# Patient Record
Sex: Female | Born: 1955
Health system: Southern US, Community
[De-identification: ages and names within clinical notes are randomized; demographics above are authoritative.]

## PROBLEM LIST (undated history)

## (undated) DIAGNOSIS — K219 Gastro-esophageal reflux disease without esophagitis: Secondary | ICD-10-CM

## (undated) DIAGNOSIS — C50919 Malignant neoplasm of unspecified site of unspecified female breast: Secondary | ICD-10-CM

## (undated) HISTORY — PX: APPENDECTOMY: SHX54

## (undated) HISTORY — PX: COLONOSCOPY: SHX174

## (undated) HISTORY — PX: CHOLECYSTECTOMY: SHX55

---

## 2000-06-05 ENCOUNTER — Inpatient Hospital Stay (HOSPITAL_COMMUNITY): Admission: EM | Admit: 2000-06-05 | Discharge: 2000-06-09 | Payer: Self-pay | Admitting: *Deleted

## 2002-12-17 ENCOUNTER — Ambulatory Visit (HOSPITAL_COMMUNITY): Admission: RE | Admit: 2002-12-17 | Discharge: 2002-12-17 | Payer: Self-pay

## 2004-04-28 ENCOUNTER — Ambulatory Visit (HOSPITAL_COMMUNITY): Admission: RE | Admit: 2004-04-28 | Discharge: 2004-04-28 | Payer: Self-pay | Admitting: Internal Medicine

## 2005-04-15 ENCOUNTER — Ambulatory Visit: Payer: Self-pay | Admitting: Cardiology

## 2006-10-11 DIAGNOSIS — C50919 Malignant neoplasm of unspecified site of unspecified female breast: Secondary | ICD-10-CM

## 2006-10-11 HISTORY — PX: BREAST LUMPECTOMY: SHX2

## 2006-10-11 HISTORY — PX: REDUCTION MAMMAPLASTY: SUR839

## 2006-10-11 HISTORY — DX: Malignant neoplasm of unspecified site of unspecified female breast: C50.919

## 2007-05-28 ENCOUNTER — Encounter: Admission: RE | Admit: 2007-05-28 | Discharge: 2007-05-28 | Payer: Self-pay | Admitting: General Surgery

## 2008-02-09 ENCOUNTER — Ambulatory Visit: Admission: RE | Admit: 2008-02-09 | Discharge: 2008-05-03 | Payer: Self-pay | Admitting: Radiation Oncology

## 2008-11-12 ENCOUNTER — Ambulatory Visit: Payer: Self-pay | Admitting: Cardiology

## 2009-04-30 ENCOUNTER — Ambulatory Visit: Payer: Self-pay | Admitting: Cardiology

## 2009-05-02 ENCOUNTER — Encounter: Payer: Self-pay | Admitting: Internal Medicine

## 2009-08-28 ENCOUNTER — Ambulatory Visit: Payer: Self-pay | Admitting: Cardiology

## 2010-10-31 ENCOUNTER — Encounter: Payer: Self-pay | Admitting: Internal Medicine

## 2011-02-26 NOTE — Consult Note (Signed)
NAME:  Natasha Andrade, Natasha Andrade                         ACCOUNT NO.:  [id number]   MEDICAL RECORD NO.:  [id number]                  PATIENT TYPE:   LOCATION:                                       FACILITY:   PHYSICIAN:  R. Roetta Sessions, M.D.              DATE OF BIRTH:  Feb 01, 1956   DATE OF CONSULTATION:  12/05/2002  DATE OF DISCHARGE:                                   CONSULTATION   REASON FOR CONSULTATION:  Iron deficiency anemia.   HISTORY OF PRESENT ILLNESS:  The patient is a pleasant 55 year old lady sent  over at the courtesy of Dr. Doreen Beam of internal medicine to further  evaluate anemia.  Apparently, she has a very low ferratin and, as well, a  B12 level producing anemia.  She has not had any melena or rectal bleeding.  No upper GI tract symptoms such as odynophagia, dysphagia, early satiety, or  reflux symptoms (except on occasion), nausea, vomiting.  No change in  weight.  Family history is significant for maternal uncle with colon cancer.   The patient apparently has been found to have a B12 deficiency, has been  supplemented.  Apparently she has attempted to give blood on numerous  occasions and has been turned down because her blood was low.  However,  one month ago she was accepted and did give a unit of blood.  Apparently,  some consideration of doing one of the Schilling tests in the near future.  She has never had her lower GI tract evaluated.  She does not use  nonsteroidal aids.   PAST MEDICAL HISTORY:  Neuropathy and lower extremity muscle wasting; she  has seen a neurologist and a podiatrist.  No specific diagnosis given as of  yet.   PAST SURGERY:  1. Appendectomy.  2. Cholecystectomy.  3. Tubal ligation.  4. Breast implant placement and removal.   MEDICATIONS:  1. She just finished a Z pack for URI.  2. Ambien 10 mg one-half tablet at bedtime p.r.n.   ALLERGIES:  No known drug allergies.   FAMILY HISTORY:  Mother succumbed to lung carcinoma.  Her  father died of  kidney failure.  One sister with kidney carcinoma; a kidney was removed six  months ago.  Otherwise, no history of chronic GI or liver disease, except in  one maternal uncle as described above.   SOCIAL HISTORY:  The patient has been married for seven years, two children,  good health.  Works for [employer] in [place].  Does not use tobacco.  Occasionally has an alcoholic beverage.   REVIEW OF SYSTEMS:  No chest pain, no dyspnea, no change in weight.  No  fever or chills.   PHYSICAL EXAMINATION:  GENERAL:  Pleasant 55 year old resting comfortably.  VITAL SIGNS:  Weight 139.5, height 5 feet 4 inches.  Temperature 98.3, blood  pressure 98/60, pulse 64.  SKIN:  Warm and dry.  HEENT:  No scleral icterus.  Conjunctivae are pink.  JVP is not prominent.  CHEST:  Lungs clear to auscultation.  CARDIAC:  Regular rate and rhythm without murmur, gallop, or rub.  ABDOMEN:  Nondistended, positive bowel sounds.  Soft, nontender without  appreciable mass or organomegaly.  EXTREMITIES:  No edema.  RECTAL:  Exam deferred to the time of the colonoscopy.   IMPRESSION:  The patient is a pleasant 55 year old lady, aside from some  chronic constipation and rare reflux symptoms is devoid of any  gastrointestinal tract symptoms.  She has iron deficiency anemia and  apparently has documented B12 deficiency.  She has recently given blood to  the [employer].  Not mentioned above, she does have what she describes as  regular periods.   I agree with Dr. Sherril Croon that she needs to have an EGD and a colonoscopy.  Will  give some consideration to biopsy her small bowel just to screen her for  celiac disease at the time of EGD.  I have discussed the EGD and colonoscopy  with the patient.  Potential risks, benefits, and alternatives have been  reviewed, questions answered, and she is agreeable.  Plan to perform EGD and  colonoscopy in the near future at [facility].  Further  recommendations to  follow.   I would like to Dr. Sherril Croon for allowing me to see this nice lady today.                                               Natasha Andrade, M.D.    RMR/MEDQ  D:  12/05/2002  T:  12/05/2002  Job:  [id number]   cc:   Doreen Beam  [address]  [place]  [place] [address]  Fax: [phone]
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
## 2011-02-26 NOTE — Op Note (Signed)
Natasha Andrade, Natasha Andrade                           ACCOUNT NO.:  [id number]   MEDICAL RECORD NO.:  [id number]                   PATIENT TYPE:  AMB   LOCATION:  DAY                                  FACILITY:  [facility]   PHYSICIAN:  Lionel December, M.D.                 DATE OF BIRTH:  01/27/1956   DATE OF PROCEDURE:  04/28/2004  DATE OF DISCHARGE:                                 OPERATIVE REPORT   PROCEDURE:  Givens small bowel capsule study.   ENDOSCOPIST:  Lionel December, M.D.   INDICATIONS:  [name] is a 55 year old Caucasian female who has iron-  deficiency anemia.  She had EGD and colonoscopy back in March by Dr. Jena Gauss  without significant findings.  She has been intolerant of iron therapy.  She  has not had any upper GI bleed.  She is undergoing a capsule study to  complete her GI evaluation.  The procedure and risks were reviewed with the  patient and informed consent was obtained.   DETAILS:  1. The patient swallowed the capsule without any difficulty.  2. Transit time through esophagus 39 seconds.  3. Pylorus was seen at 1 minute and 57 seconds.  4. Capsule was at ileocecal valve at 1 hour.  5. Transit time to ileocecal valve was 5 hours and 38 minutes.   QUALITY OF STUDY:  Satisfactory.   FINDINGS:  There is a small focal area of erythema in both bulbar duodenum.  There were no ulcers, erosions or angiodysplasia.  The mucosa of the small  bowel also had normal feathery appearance.   FINAL DIAGNOSES:  Normal small bowel capsule study.   RECOMMENDATIONS:  1. You may consider total dose iron infusion, will discuss with Dr. Sherril Croon.  2. I also reviewed results of study with the patient over the phone and     suggested she try a pediatric dose of liquid iron.      ___________________________________________                                            Lionel December, M.D.   NR/MEDQ  D:  04/29/2004  T:  04/30/2004  Job:  [id number]   cc:   Doreen Beam  [address]  [place]  [place] [address]  Fax: [phone]

## 2011-02-26 NOTE — Op Note (Signed)
NAMEJAYLENNE, Natasha Andrade                           ACCOUNT NO.:  [id number]   MEDICAL RECORD NO.:  [id number]                   PATIENT TYPE:  AMB   LOCATION:  DAY                                  FACILITY:  [facility]   PHYSICIAN:  R. Roetta Sessions, M.D.              DATE OF BIRTH:  1956-04-10   DATE OF PROCEDURE:  12/17/2002  DATE OF DISCHARGE:                                 OPERATIVE REPORT   PROCEDURE:  Esophagogastroduodenoscopy with small bowel biopsy followed by  diagnostic colonoscopy.   INDICATIONS FOR PROCEDURE:  The patient is a 55 year old lady with iron  deficiency anemia as well as documented B12 deficiency.  She is referred for  further evaluation from a GI standpoint.  EGD and colonoscopy are now being  done.  The procedure has been discussed with the patient at length.  The  potential risks, benefits, and alternatives have been reviewed and questions  answered.  She is agreeable.  Please see my dictated consultation note of  12/05/2002 for more information.   PROCEDURE:  O2 saturation, blood pressure, pulses, and respirations were  monitored throughout the entire procedure.  Conscious sedation with 5 mg  Versed, 100 mg Demerol in divided doses.  The instrument used was the  Olympus video gastroscope and colonoscope.   FINDINGS:   EGD:  Examination of the tubular esophagus revealed no mucosal  abnormalities.  The EG junction was easily traversed.   Stomach:  The gastric cavity was empty and insufflated well with air.  A  thorough examination of the gastric mucosa, including a retroflexed view of  the proximal stomach and esophagogastric junction demonstrated no  abnormalities.  The pylorus was patent and easily traversed.   Duodenum:  Examination of the bulb and second and third portions appeared  normal.   THERAPEUTIC/DIAGNOSTIC MANEUVERS PERFORMED:  Multiple biopsies of the D2 and  D3 were taken to screen for celiac disease.  The patient tolerated the  procedure  well and was prepared for colonoscopy.   COLONOSCOPY:  Digital rectal exam revealed no abnormalities.  The prep was  good.   Rectum:  Examination of the rectal mucosa, including retroflex view of the  anal verge revealed internal hemorrhoids.   Colon:  The colonic mucosa was surveyed from the rectosigmoid junction  through the left transverse and right colon to the area of the appendiceal  orifice, ileocecal valve, and cecum.  These structures were well-seen and  photographed for the record.  The patient had diffuse scattered narrow mouth  left-sided diverticula.  The remainder of the colonic mucosa to the cecum  appeared normal.  The terminal ileum was intubated to 10 cm.  This segment  of the GI tract also appeared normal.  From this level, the scope was slowly  withdrawn.  All previously mentioned mucosal surfaces were again seen, and  again, no other abnormalities were observed.  The patient tolerated both  procedures well and was reactive in endoscopy.   IMPRESSION:  1. Esophagogastroduodenoscopy.  Normal upper gastrointestinal tract, status     post biopsy of duodenum 2 and duodenum 3.  2. Colonoscopy.  Internal hemorrhoids.  Otherwise normal rectum.  Few     scattered left-sided diverticula.  Remainder of colonic mucosa and     terminal ileum appeared normal.   RECOMMENDATIONS:  1. Will follow up on small bowel biopsies.  2. Diverticulosis literature provided to the patient.  3. Further recommendations to follow.                                               Jonathon Bellows, M.D.    RMR/MEDQ  D:  12/17/2002  T:  12/17/2002  Job:  [id number]   cc:   Doreen Beam  [address]  [place]  [place] [address]  Fax: [phone]
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
## 2012-03-21 ENCOUNTER — Encounter: Payer: Self-pay | Admitting: Internal Medicine

## 2012-03-22 ENCOUNTER — Encounter: Payer: Medicare Other | Admitting: Internal Medicine

## 2012-09-21 ENCOUNTER — Encounter: Payer: Medicare Other | Admitting: Internal Medicine

## 2012-10-09 DIAGNOSIS — C50919 Malignant neoplasm of unspecified site of unspecified female breast: Secondary | ICD-10-CM

## 2013-04-11 ENCOUNTER — Encounter: Payer: Medicare Other | Admitting: Internal Medicine

## 2013-04-11 DIAGNOSIS — Z09 Encounter for follow-up examination after completed treatment for conditions other than malignant neoplasm: Secondary | ICD-10-CM

## 2013-04-11 DIAGNOSIS — G589 Mononeuropathy, unspecified: Secondary | ICD-10-CM

## 2013-04-11 DIAGNOSIS — C50919 Malignant neoplasm of unspecified site of unspecified female breast: Secondary | ICD-10-CM

## 2016-01-30 DIAGNOSIS — H2513 Age-related nuclear cataract, bilateral: Secondary | ICD-10-CM | POA: Diagnosis not present

## 2016-01-30 DIAGNOSIS — H40033 Anatomical narrow angle, bilateral: Secondary | ICD-10-CM | POA: Diagnosis not present

## 2016-02-06 DIAGNOSIS — G44219 Episodic tension-type headache, not intractable: Secondary | ICD-10-CM | POA: Diagnosis not present

## 2016-02-20 DIAGNOSIS — N390 Urinary tract infection, site not specified: Secondary | ICD-10-CM | POA: Diagnosis not present

## 2016-02-20 DIAGNOSIS — Z789 Other specified health status: Secondary | ICD-10-CM | POA: Diagnosis not present

## 2016-02-20 DIAGNOSIS — Z299 Encounter for prophylactic measures, unspecified: Secondary | ICD-10-CM | POA: Diagnosis not present

## 2016-02-20 DIAGNOSIS — R3911 Hesitancy of micturition: Secondary | ICD-10-CM | POA: Diagnosis not present

## 2016-03-16 DIAGNOSIS — Z79899 Other long term (current) drug therapy: Secondary | ICD-10-CM | POA: Diagnosis not present

## 2016-03-16 DIAGNOSIS — Z1211 Encounter for screening for malignant neoplasm of colon: Secondary | ICD-10-CM | POA: Diagnosis not present

## 2016-03-16 DIAGNOSIS — W57XXXA Bitten or stung by nonvenomous insect and other nonvenomous arthropods, initial encounter: Secondary | ICD-10-CM | POA: Diagnosis not present

## 2016-03-16 DIAGNOSIS — Z0000 Encounter for general adult medical examination without abnormal findings: Secondary | ICD-10-CM | POA: Diagnosis not present

## 2016-03-16 DIAGNOSIS — Z7189 Other specified counseling: Secondary | ICD-10-CM | POA: Diagnosis not present

## 2016-03-16 DIAGNOSIS — Z299 Encounter for prophylactic measures, unspecified: Secondary | ICD-10-CM | POA: Diagnosis not present

## 2016-03-16 DIAGNOSIS — Z6826 Body mass index (BMI) 26.0-26.9, adult: Secondary | ICD-10-CM | POA: Diagnosis not present

## 2016-03-16 DIAGNOSIS — E539 Vitamin B deficiency, unspecified: Secondary | ICD-10-CM | POA: Diagnosis not present

## 2016-03-16 DIAGNOSIS — R5383 Other fatigue: Secondary | ICD-10-CM | POA: Diagnosis not present

## 2016-03-16 DIAGNOSIS — Z1389 Encounter for screening for other disorder: Secondary | ICD-10-CM | POA: Diagnosis not present

## 2016-04-22 DIAGNOSIS — Z1231 Encounter for screening mammogram for malignant neoplasm of breast: Secondary | ICD-10-CM | POA: Diagnosis not present

## 2016-05-20 DIAGNOSIS — Z205 Contact with and (suspected) exposure to viral hepatitis: Secondary | ICD-10-CM | POA: Diagnosis not present

## 2016-05-20 DIAGNOSIS — B029 Zoster without complications: Secondary | ICD-10-CM | POA: Diagnosis not present

## 2016-05-20 DIAGNOSIS — C50919 Malignant neoplasm of unspecified site of unspecified female breast: Secondary | ICD-10-CM | POA: Diagnosis not present

## 2016-05-20 DIAGNOSIS — M25511 Pain in right shoulder: Secondary | ICD-10-CM | POA: Diagnosis not present

## 2016-11-01 DIAGNOSIS — Z0001 Encounter for general adult medical examination with abnormal findings: Secondary | ICD-10-CM | POA: Diagnosis not present

## 2016-11-15 DIAGNOSIS — R69 Illness, unspecified: Secondary | ICD-10-CM | POA: Diagnosis not present

## 2016-12-10 DIAGNOSIS — Z0001 Encounter for general adult medical examination with abnormal findings: Secondary | ICD-10-CM | POA: Diagnosis not present

## 2016-12-13 DIAGNOSIS — R69 Illness, unspecified: Secondary | ICD-10-CM | POA: Diagnosis not present

## 2017-01-27 DIAGNOSIS — D485 Neoplasm of uncertain behavior of skin: Secondary | ICD-10-CM | POA: Diagnosis not present

## 2017-01-27 DIAGNOSIS — C44319 Basal cell carcinoma of skin of other parts of face: Secondary | ICD-10-CM | POA: Diagnosis not present

## 2017-01-27 DIAGNOSIS — L821 Other seborrheic keratosis: Secondary | ICD-10-CM | POA: Diagnosis not present

## 2017-02-10 DIAGNOSIS — C44311 Basal cell carcinoma of skin of nose: Secondary | ICD-10-CM | POA: Diagnosis not present

## 2017-02-22 DIAGNOSIS — R69 Illness, unspecified: Secondary | ICD-10-CM | POA: Diagnosis not present

## 2017-03-14 DIAGNOSIS — Z6823 Body mass index (BMI) 23.0-23.9, adult: Secondary | ICD-10-CM | POA: Diagnosis not present

## 2017-03-14 DIAGNOSIS — M76891 Other specified enthesopathies of right lower limb, excluding foot: Secondary | ICD-10-CM | POA: Diagnosis not present

## 2017-03-14 DIAGNOSIS — Z1329 Encounter for screening for other suspected endocrine disorder: Secondary | ICD-10-CM | POA: Diagnosis not present

## 2017-03-14 DIAGNOSIS — Z79899 Other long term (current) drug therapy: Secondary | ICD-10-CM | POA: Diagnosis not present

## 2017-03-14 DIAGNOSIS — Z13220 Encounter for screening for lipoid disorders: Secondary | ICD-10-CM | POA: Diagnosis not present

## 2017-03-14 DIAGNOSIS — R69 Illness, unspecified: Secondary | ICD-10-CM | POA: Diagnosis not present

## 2017-05-23 ENCOUNTER — Other Ambulatory Visit (HOSPITAL_COMMUNITY): Payer: Self-pay | Admitting: Internal Medicine

## 2017-05-23 DIAGNOSIS — Z1231 Encounter for screening mammogram for malignant neoplasm of breast: Secondary | ICD-10-CM

## 2017-05-30 ENCOUNTER — Ambulatory Visit (HOSPITAL_COMMUNITY)
Admission: RE | Admit: 2017-05-30 | Discharge: 2017-05-30 | Disposition: A | Discharge status: Home / Self Care | Payer: Medicare HMO | Source: Ambulatory Visit | Attending: Internal Medicine | Admitting: Internal Medicine

## 2017-05-30 ENCOUNTER — Encounter (HOSPITAL_COMMUNITY): Payer: Self-pay | Admitting: Radiology

## 2017-05-30 DIAGNOSIS — Z1231 Encounter for screening mammogram for malignant neoplasm of breast: Secondary | ICD-10-CM

## 2017-05-30 HISTORY — DX: Malignant neoplasm of unspecified site of unspecified female breast: C50.919

## 2017-06-14 DIAGNOSIS — R5383 Other fatigue: Secondary | ICD-10-CM | POA: Diagnosis not present

## 2017-06-14 DIAGNOSIS — R69 Illness, unspecified: Secondary | ICD-10-CM | POA: Diagnosis not present

## 2017-06-14 DIAGNOSIS — Z6822 Body mass index (BMI) 22.0-22.9, adult: Secondary | ICD-10-CM | POA: Diagnosis not present

## 2017-06-14 DIAGNOSIS — D518 Other vitamin B12 deficiency anemias: Secondary | ICD-10-CM | POA: Diagnosis not present

## 2017-06-14 DIAGNOSIS — R5381 Other malaise: Secondary | ICD-10-CM | POA: Diagnosis not present

## 2017-06-16 DIAGNOSIS — R69 Illness, unspecified: Secondary | ICD-10-CM | POA: Diagnosis not present

## 2017-08-30 DIAGNOSIS — G5601 Carpal tunnel syndrome, right upper limb: Secondary | ICD-10-CM | POA: Diagnosis not present

## 2017-08-30 DIAGNOSIS — S46011A Strain of muscle(s) and tendon(s) of the rotator cuff of right shoulder, initial encounter: Secondary | ICD-10-CM | POA: Diagnosis not present

## 2017-08-30 DIAGNOSIS — M542 Cervicalgia: Secondary | ICD-10-CM | POA: Diagnosis not present

## 2017-09-05 DIAGNOSIS — R69 Illness, unspecified: Secondary | ICD-10-CM | POA: Diagnosis not present

## 2017-09-06 DIAGNOSIS — R69 Illness, unspecified: Secondary | ICD-10-CM | POA: Diagnosis not present

## 2017-09-12 DIAGNOSIS — J302 Other seasonal allergic rhinitis: Secondary | ICD-10-CM | POA: Diagnosis not present

## 2017-09-12 DIAGNOSIS — R69 Illness, unspecified: Secondary | ICD-10-CM | POA: Diagnosis not present

## 2017-09-12 DIAGNOSIS — M545 Low back pain: Secondary | ICD-10-CM | POA: Diagnosis not present

## 2017-09-12 DIAGNOSIS — Z6823 Body mass index (BMI) 23.0-23.9, adult: Secondary | ICD-10-CM | POA: Diagnosis not present

## 2017-11-26 DIAGNOSIS — R69 Illness, unspecified: Secondary | ICD-10-CM | POA: Diagnosis not present

## 2017-12-05 DIAGNOSIS — R69 Illness, unspecified: Secondary | ICD-10-CM | POA: Diagnosis not present

## 2017-12-15 DIAGNOSIS — R69 Illness, unspecified: Secondary | ICD-10-CM | POA: Diagnosis not present

## 2017-12-15 DIAGNOSIS — Z6823 Body mass index (BMI) 23.0-23.9, adult: Secondary | ICD-10-CM | POA: Diagnosis not present

## 2017-12-15 DIAGNOSIS — Z0000 Encounter for general adult medical examination without abnormal findings: Secondary | ICD-10-CM | POA: Diagnosis not present

## 2017-12-15 DIAGNOSIS — J302 Other seasonal allergic rhinitis: Secondary | ICD-10-CM | POA: Diagnosis not present

## 2017-12-15 DIAGNOSIS — M545 Low back pain: Secondary | ICD-10-CM | POA: Diagnosis not present

## 2018-01-10 DIAGNOSIS — H5200 Hypermetropia, unspecified eye: Secondary | ICD-10-CM | POA: Diagnosis not present

## 2018-01-10 DIAGNOSIS — M7021 Olecranon bursitis, right elbow: Secondary | ICD-10-CM | POA: Diagnosis not present

## 2018-01-10 DIAGNOSIS — Z0100 Encounter for examination of eyes and vision without abnormal findings: Secondary | ICD-10-CM | POA: Diagnosis not present

## 2018-01-10 DIAGNOSIS — M75121 Complete rotator cuff tear or rupture of right shoulder, not specified as traumatic: Secondary | ICD-10-CM | POA: Diagnosis not present

## 2018-04-17 DIAGNOSIS — J302 Other seasonal allergic rhinitis: Secondary | ICD-10-CM | POA: Diagnosis not present

## 2018-04-17 DIAGNOSIS — R69 Illness, unspecified: Secondary | ICD-10-CM | POA: Diagnosis not present

## 2018-04-17 DIAGNOSIS — Z79899 Other long term (current) drug therapy: Secondary | ICD-10-CM | POA: Diagnosis not present

## 2018-04-17 DIAGNOSIS — Z6823 Body mass index (BMI) 23.0-23.9, adult: Secondary | ICD-10-CM | POA: Diagnosis not present

## 2018-04-17 DIAGNOSIS — S43421A Sprain of right rotator cuff capsule, initial encounter: Secondary | ICD-10-CM | POA: Diagnosis not present

## 2018-04-17 DIAGNOSIS — M545 Low back pain: Secondary | ICD-10-CM | POA: Diagnosis not present

## 2018-04-17 DIAGNOSIS — Z0000 Encounter for general adult medical examination without abnormal findings: Secondary | ICD-10-CM | POA: Diagnosis not present

## 2018-05-16 DIAGNOSIS — M25511 Pain in right shoulder: Secondary | ICD-10-CM | POA: Diagnosis not present

## 2018-05-24 DIAGNOSIS — M25511 Pain in right shoulder: Secondary | ICD-10-CM | POA: Diagnosis not present

## 2018-05-31 DIAGNOSIS — M25511 Pain in right shoulder: Secondary | ICD-10-CM | POA: Diagnosis not present

## 2018-06-30 ENCOUNTER — Other Ambulatory Visit (HOSPITAL_COMMUNITY): Payer: Self-pay | Admitting: Internal Medicine

## 2018-06-30 DIAGNOSIS — Z1231 Encounter for screening mammogram for malignant neoplasm of breast: Secondary | ICD-10-CM

## 2018-07-06 ENCOUNTER — Ambulatory Visit (HOSPITAL_COMMUNITY)
Admission: RE | Admit: 2018-07-06 | Discharge: 2018-07-06 | Disposition: A | Discharge status: Home / Self Care | Payer: Medicare HMO | Source: Ambulatory Visit | Attending: Internal Medicine | Admitting: Internal Medicine

## 2018-07-06 ENCOUNTER — Encounter (HOSPITAL_COMMUNITY): Payer: Self-pay

## 2018-07-06 DIAGNOSIS — Z1231 Encounter for screening mammogram for malignant neoplasm of breast: Secondary | ICD-10-CM | POA: Insufficient documentation

## 2018-07-17 DIAGNOSIS — R69 Illness, unspecified: Secondary | ICD-10-CM | POA: Diagnosis not present

## 2018-07-17 DIAGNOSIS — Z6824 Body mass index (BMI) 24.0-24.9, adult: Secondary | ICD-10-CM | POA: Diagnosis not present

## 2018-07-17 DIAGNOSIS — J302 Other seasonal allergic rhinitis: Secondary | ICD-10-CM | POA: Diagnosis not present

## 2018-07-17 DIAGNOSIS — S43421D Sprain of right rotator cuff capsule, subsequent encounter: Secondary | ICD-10-CM | POA: Diagnosis not present

## 2018-08-07 DIAGNOSIS — R69 Illness, unspecified: Secondary | ICD-10-CM | POA: Diagnosis not present

## 2018-10-17 DIAGNOSIS — Z6825 Body mass index (BMI) 25.0-25.9, adult: Secondary | ICD-10-CM | POA: Diagnosis not present

## 2018-10-17 DIAGNOSIS — J329 Chronic sinusitis, unspecified: Secondary | ICD-10-CM | POA: Diagnosis not present

## 2018-12-07 DIAGNOSIS — R69 Illness, unspecified: Secondary | ICD-10-CM | POA: Diagnosis not present

## 2019-02-13 DIAGNOSIS — Z6825 Body mass index (BMI) 25.0-25.9, adult: Secondary | ICD-10-CM | POA: Diagnosis not present

## 2019-02-13 DIAGNOSIS — L309 Dermatitis, unspecified: Secondary | ICD-10-CM | POA: Diagnosis not present

## 2019-05-16 DIAGNOSIS — R69 Illness, unspecified: Secondary | ICD-10-CM | POA: Diagnosis not present

## 2019-05-16 DIAGNOSIS — Z6824 Body mass index (BMI) 24.0-24.9, adult: Secondary | ICD-10-CM | POA: Diagnosis not present

## 2019-05-16 DIAGNOSIS — Z0000 Encounter for general adult medical examination without abnormal findings: Secondary | ICD-10-CM | POA: Diagnosis not present

## 2019-05-16 DIAGNOSIS — M545 Low back pain: Secondary | ICD-10-CM | POA: Diagnosis not present

## 2019-05-16 DIAGNOSIS — Z1389 Encounter for screening for other disorder: Secondary | ICD-10-CM | POA: Diagnosis not present

## 2019-06-20 ENCOUNTER — Other Ambulatory Visit (HOSPITAL_COMMUNITY): Payer: Self-pay | Admitting: Internal Medicine

## 2019-06-20 DIAGNOSIS — Z1231 Encounter for screening mammogram for malignant neoplasm of breast: Secondary | ICD-10-CM

## 2019-07-09 ENCOUNTER — Other Ambulatory Visit: Payer: Self-pay

## 2019-07-09 ENCOUNTER — Ambulatory Visit (HOSPITAL_COMMUNITY)
Admission: RE | Admit: 2019-07-09 | Discharge: 2019-07-09 | Disposition: A | Discharge status: Home / Self Care | Payer: Medicare HMO | Source: Ambulatory Visit | Attending: Internal Medicine | Admitting: Internal Medicine

## 2019-07-09 DIAGNOSIS — Z1231 Encounter for screening mammogram for malignant neoplasm of breast: Secondary | ICD-10-CM | POA: Diagnosis not present

## 2019-07-16 DIAGNOSIS — M8588 Other specified disorders of bone density and structure, other site: Secondary | ICD-10-CM | POA: Diagnosis not present

## 2019-07-16 DIAGNOSIS — M810 Age-related osteoporosis without current pathological fracture: Secondary | ICD-10-CM | POA: Diagnosis not present

## 2019-07-27 ENCOUNTER — Other Ambulatory Visit: Payer: Self-pay

## 2019-07-27 DIAGNOSIS — Z20828 Contact with and (suspected) exposure to other viral communicable diseases: Secondary | ICD-10-CM | POA: Diagnosis not present

## 2019-07-27 DIAGNOSIS — Z20822 Contact with and (suspected) exposure to covid-19: Secondary | ICD-10-CM

## 2019-07-28 LAB — NOVEL CORONAVIRUS, NAA: SARS-CoV-2, NAA: NOT DETECTED

## 2019-07-31 ENCOUNTER — Telehealth: Payer: Self-pay | Admitting: General Practice

## 2019-07-31 NOTE — Telephone Encounter (Signed)
Gave patient negative covid test results Patient understood 

## 2019-08-02 DIAGNOSIS — D225 Melanocytic nevi of trunk: Secondary | ICD-10-CM | POA: Diagnosis not present

## 2019-08-02 DIAGNOSIS — C44311 Basal cell carcinoma of skin of nose: Secondary | ICD-10-CM | POA: Diagnosis not present

## 2019-08-02 DIAGNOSIS — L308 Other specified dermatitis: Secondary | ICD-10-CM | POA: Diagnosis not present

## 2019-08-16 DIAGNOSIS — R69 Illness, unspecified: Secondary | ICD-10-CM | POA: Diagnosis not present

## 2019-08-16 DIAGNOSIS — Z6826 Body mass index (BMI) 26.0-26.9, adult: Secondary | ICD-10-CM | POA: Diagnosis not present

## 2019-08-16 DIAGNOSIS — M7501 Adhesive capsulitis of right shoulder: Secondary | ICD-10-CM | POA: Diagnosis not present

## 2019-08-16 DIAGNOSIS — M545 Low back pain: Secondary | ICD-10-CM | POA: Diagnosis not present

## 2019-09-08 DIAGNOSIS — R69 Illness, unspecified: Secondary | ICD-10-CM | POA: Diagnosis not present

## 2019-09-13 DIAGNOSIS — Z85828 Personal history of other malignant neoplasm of skin: Secondary | ICD-10-CM | POA: Diagnosis not present

## 2019-09-13 DIAGNOSIS — Z08 Encounter for follow-up examination after completed treatment for malignant neoplasm: Secondary | ICD-10-CM | POA: Diagnosis not present

## 2019-09-13 DIAGNOSIS — C44311 Basal cell carcinoma of skin of nose: Secondary | ICD-10-CM | POA: Diagnosis not present

## 2019-10-22 ENCOUNTER — Other Ambulatory Visit: Payer: Self-pay

## 2019-10-22 ENCOUNTER — Emergency Department (HOSPITAL_COMMUNITY)
Admission: EM | Admit: 2019-10-22 | Discharge: 2019-10-22 | Disposition: A | Discharge status: Home / Self Care | Payer: Medicare HMO | Attending: Emergency Medicine | Admitting: Emergency Medicine

## 2019-10-22 ENCOUNTER — Encounter (HOSPITAL_COMMUNITY): Payer: Self-pay | Admitting: *Deleted

## 2019-10-22 DIAGNOSIS — U071 COVID-19: Secondary | ICD-10-CM | POA: Diagnosis not present

## 2019-10-22 DIAGNOSIS — B349 Viral infection, unspecified: Secondary | ICD-10-CM | POA: Diagnosis not present

## 2019-10-22 DIAGNOSIS — R05 Cough: Secondary | ICD-10-CM | POA: Diagnosis present

## 2019-10-22 NOTE — Discharge Instructions (Addendum)
Return if any problems.  Your covid test is pending  °

## 2019-10-22 NOTE — ED Provider Notes (Signed)
[facility] Provider Note   CSN: [id number] Arrival date & time: 10/22/19  1357     History No chief complaint on file.   Natasha Andrade is a 64 y.o. female.  The history is provided by the patient. No language interpreter was used.  Cough Cough characteristics:  Non-productive Sputum characteristics:  Nondescript Severity:  Moderate Onset quality:  Gradual Timing:  Constant Progression:  Worsening Chronicity:  New Relieved by:  Nothing Worsened by:  Nothing Ineffective treatments:  None tried Associated symptoms: sinus congestion      Pt complains of nasal congestion and sinus congestion   Past Medical History:  Diagnosis Date  . Breast cancer [facility]) 2008   right    There are no problems to display for this patient.   Past Surgical History:  Procedure Laterality Date  . APPENDECTOMY    . BREAST LUMPECTOMY Right 2008  . CHOLECYSTECTOMY    . REDUCTION MAMMAPLASTY Bilateral 2008     OB History   No obstetric history on file.     No family history on file.  Social History   Tobacco Use  . Smoking status: Never Smoker  . Smokeless tobacco: Never Used  Substance Use Topics  . Alcohol use: Yes    Comment: occasionally   . Drug use: Never    Home Medications Prior to Admission medications   Not on File    Allergies    Patient has no known allergies.  Review of Systems   Review of Systems  Respiratory: Positive for cough.   All other systems reviewed and are negative.   Physical Exam Updated Vital Signs BP 122/89   Pulse 88   Temp 98.4 F (36.9 C) (Oral)   Resp 17   Ht 5\' 4"  (1.626 m)   Wt 72.6 kg   SpO2 100%   BMI 27.46 kg/m   Physical Exam Vitals and nursing note reviewed.  Constitutional:      Appearance: She is well-developed.  HENT:     Head: Normocephalic.     Right Ear: Tympanic membrane normal.     Left Ear: Tympanic membrane normal.     Nose: Nose normal.  Cardiovascular:     Rate and Rhythm:  Normal rate.  Pulmonary:     Effort: Pulmonary effort is normal.  Abdominal:     General: There is no distension.  Musculoskeletal:        General: Normal range of motion.     Cervical back: Normal range of motion.  Skin:    General: Skin is warm.  Neurological:     Mental Status: She is alert and oriented to person, place, and time.  Psychiatric:        Mood and Affect: Mood normal.     ED Results / Procedures / Treatments   Labs (all labs ordered are listed, but only abnormal results are displayed) Labs Reviewed  SARS CORONAVIRUS 2 (TAT 6-24 HRS)    EKG None  Radiology No results found.  Procedures Procedures (including critical care time)  Medications Ordered in ED Medications - No data to display  ED Course  I have reviewed the triage vital signs and the nursing notes.  Pertinent labs & imaging results that were available during my care of the patient were reviewed by me and considered in my medical decision making (see chart for details).    MDM Rules/Calculators/A&P  MDM  (Pt is here with husband who is covid positive.  I suspect she has covid.)  Covid ordered.  Pt advised to quarantine Final Clinical Impression(s) / ED Diagnoses Final diagnoses:  Viral illness    Rx / DC Orders ED Discharge Orders    None    An After Visit Summary was printed and given to the patient.   Sidney Ace 10/22/19 Asencion Gowda, MD 10/22/19 2251

## 2019-10-22 NOTE — ED Triage Notes (Signed)
Pt c/o loss of sense of smell, nasal congestion, low grade fever, fatigue x 2 weeks. Pt took antibiotics about 2 weeks ago for possible sinus infection. Denies known Covid exposure.

## 2019-10-23 LAB — SARS CORONAVIRUS 2 (TAT 6-24 HRS): SARS Coronavirus 2: POSITIVE — AB

## 2019-10-25 ENCOUNTER — Telehealth (HOSPITAL_COMMUNITY): Payer: Self-pay

## 2019-11-19 DIAGNOSIS — R69 Illness, unspecified: Secondary | ICD-10-CM | POA: Diagnosis not present

## 2019-11-19 DIAGNOSIS — M545 Low back pain: Secondary | ICD-10-CM | POA: Diagnosis not present

## 2019-11-19 DIAGNOSIS — Z6827 Body mass index (BMI) 27.0-27.9, adult: Secondary | ICD-10-CM | POA: Diagnosis not present

## 2019-11-19 DIAGNOSIS — J328 Other chronic sinusitis: Secondary | ICD-10-CM | POA: Diagnosis not present

## 2020-01-10 DIAGNOSIS — M2550 Pain in unspecified joint: Secondary | ICD-10-CM | POA: Diagnosis not present

## 2020-01-10 DIAGNOSIS — M79641 Pain in right hand: Secondary | ICD-10-CM | POA: Diagnosis not present

## 2020-01-10 DIAGNOSIS — M545 Low back pain: Secondary | ICD-10-CM | POA: Diagnosis not present

## 2020-01-10 DIAGNOSIS — M19041 Primary osteoarthritis, right hand: Secondary | ICD-10-CM | POA: Diagnosis not present

## 2020-02-18 DIAGNOSIS — M545 Low back pain: Secondary | ICD-10-CM | POA: Diagnosis not present

## 2020-02-18 DIAGNOSIS — R69 Illness, unspecified: Secondary | ICD-10-CM | POA: Diagnosis not present

## 2020-02-18 DIAGNOSIS — Z6827 Body mass index (BMI) 27.0-27.9, adult: Secondary | ICD-10-CM | POA: Diagnosis not present

## 2020-03-27 DIAGNOSIS — H5213 Myopia, bilateral: Secondary | ICD-10-CM | POA: Diagnosis not present

## 2020-05-26 DIAGNOSIS — R69 Illness, unspecified: Secondary | ICD-10-CM | POA: Diagnosis not present

## 2020-05-26 DIAGNOSIS — Z6827 Body mass index (BMI) 27.0-27.9, adult: Secondary | ICD-10-CM | POA: Diagnosis not present

## 2020-05-26 DIAGNOSIS — M545 Low back pain: Secondary | ICD-10-CM | POA: Diagnosis not present

## 2020-06-23 ENCOUNTER — Other Ambulatory Visit (HOSPITAL_COMMUNITY): Payer: Self-pay | Admitting: Internal Medicine

## 2020-06-23 DIAGNOSIS — Z1231 Encounter for screening mammogram for malignant neoplasm of breast: Secondary | ICD-10-CM

## 2020-07-08 ENCOUNTER — Encounter (INDEPENDENT_AMBULATORY_CARE_PROVIDER_SITE_OTHER): Payer: Self-pay | Admitting: Otolaryngology

## 2020-07-08 ENCOUNTER — Other Ambulatory Visit: Payer: Self-pay

## 2020-07-08 ENCOUNTER — Ambulatory Visit (INDEPENDENT_AMBULATORY_CARE_PROVIDER_SITE_OTHER): Payer: Medicare HMO | Admitting: Otolaryngology

## 2020-07-08 VITALS — Temp 97.9°F

## 2020-07-08 DIAGNOSIS — K219 Gastro-esophageal reflux disease without esophagitis: Secondary | ICD-10-CM

## 2020-07-08 DIAGNOSIS — H903 Sensorineural hearing loss, bilateral: Secondary | ICD-10-CM

## 2020-07-08 DIAGNOSIS — J310 Chronic rhinitis: Secondary | ICD-10-CM

## 2020-07-08 NOTE — Progress Notes (Signed)
HPI: Natasha Andrade is a 64 y.o. female who presents for evaluation of decreased hearing where she has difficulty understanding people.  She also complains of chronic postnasal drainage and always trying to clear her throat.  She is unable to blow anything out of her nose.  She has previously been treated with a Z-Pak. She does have history of bad reflux disease and takes pantoprazole 20 mg in the morning.  She has also tried Advil cold and sinus.  She also takes Zyrtec 10 mg. Denies any yellow-green discharge from her nose.  Past Medical History:  Diagnosis Date  . Breast cancer [facility]) 2008   right   Past Surgical History:  Procedure Laterality Date  . APPENDECTOMY    . BREAST LUMPECTOMY Right 2008  . CHOLECYSTECTOMY    . REDUCTION MAMMAPLASTY Bilateral 2008   Social History   Socioeconomic History  . Marital status: Married    Spouse name: Not on file  . Number of children: Not on file  . Years of education: Not on file  . Highest education level: Not on file  Occupational History  . Not on file  Tobacco Use  . Smoking status: Never Smoker  . Smokeless tobacco: Never Used  Substance and Sexual Activity  . Alcohol use: Yes    Comment: occasionally   . Drug use: Never  . Sexual activity: Not on file  Other Topics Concern  . Not on file  Social History Narrative  . Not on file   Social Determinants of Health   Financial Resource Strain:   . Difficulty of Paying Living Expenses: Not on file  Food Insecurity:   . Worried About Charity fundraiser in the Last Year: Not on file  . Ran Out of Food in the Last Year: Not on file  Transportation Needs:   . Lack of Transportation (Medical): Not on file  . Lack of Transportation (Non-Medical): Not on file  Physical Activity:   . Days of Exercise per Week: Not on file  . Minutes of Exercise per Session: Not on file  Stress:   . Feeling of Stress : Not on file  Social Connections:   . Frequency of Communication with Friends  and Family: Not on file  . Frequency of Social Gatherings with Friends and Family: Not on file  . Attends Religious Services: Not on file  . Active Member of Clubs or Organizations: Not on file  . Attends Archivist Meetings: Not on file  . Marital Status: Not on file   No family history on file. No Known Allergies Prior to Admission medications   Not on File     Positive ROS: Otherwise negative  All other systems have been reviewed and were otherwise negative with the exception of those mentioned in the HPI and as above.  Physical Exam: Constitutional: Alert, well-appearing, no acute distress Ears: External ears without lesions or tenderness. Ear canals are clear bilaterally with intact, clear TMs bilaterally. Nasal: External nose without lesions. Septum midline.  Both middle meatus regions are clear..  Nasal passages are clear bilaterally with minimal clear mucus discharge within the nasal cavity. Oral: Lips and gums without lesions. Tongue and palate mucosa without lesions. Posterior oropharynx clear.  Indirect laryngoscopy revealed a clear hypopharynx and larynx.  Moderate mucosal arytenoid edema but no lesions noted. Neck: No palpable adenopathy or masses Respiratory: Breathing comfortably  Skin: No facial/neck lesions or rash noted.  Audiogram demonstrated normal hearing in both ears up to  2000 frequency with dropping hearing in the upper frequencies at 3000 frequency and above down to 40-50 DB.  She had type A tympanograms bilaterally.  Procedures  Assessment: Reviewed with her that she has high-frequency SNHL in both ears and the only treatment option for this would be hearing aids. History of GERD. Mild rhinitis.  Plan: Concerning her complaints of thick mucus and postnasal drainage recommended use of Nasacort 2 sprays each nostril at night as well as saline irrigation and possible use of Mucinex.  Also suggested taking omeprazole before dinner instead of first  thing in the morning as this will provide better coverage of laryngeal pharyngeal reflux which occurs more commonly at night when she is lying flat. Concerning her hearing problems the only option she would have would be hearing aids and discussed this with her.  Radene Journey, MD

## 2020-07-10 ENCOUNTER — Ambulatory Visit (HOSPITAL_COMMUNITY)
Admission: RE | Admit: 2020-07-10 | Discharge: 2020-07-10 | Disposition: A | Discharge status: Home / Self Care | Payer: Medicare HMO | Source: Ambulatory Visit | Attending: Internal Medicine | Admitting: Internal Medicine

## 2020-07-10 ENCOUNTER — Other Ambulatory Visit: Payer: Self-pay

## 2020-07-10 ENCOUNTER — Ambulatory Visit (HOSPITAL_COMMUNITY): Payer: Medicare HMO

## 2020-07-10 DIAGNOSIS — Z1231 Encounter for screening mammogram for malignant neoplasm of breast: Secondary | ICD-10-CM | POA: Insufficient documentation

## 2020-07-17 DIAGNOSIS — R69 Illness, unspecified: Secondary | ICD-10-CM | POA: Diagnosis not present

## 2020-07-21 ENCOUNTER — Encounter (INDEPENDENT_AMBULATORY_CARE_PROVIDER_SITE_OTHER): Payer: Self-pay

## 2020-08-27 DIAGNOSIS — Z1331 Encounter for screening for depression: Secondary | ICD-10-CM | POA: Diagnosis not present

## 2020-08-27 DIAGNOSIS — Z6828 Body mass index (BMI) 28.0-28.9, adult: Secondary | ICD-10-CM | POA: Diagnosis not present

## 2020-08-27 DIAGNOSIS — M545 Low back pain, unspecified: Secondary | ICD-10-CM | POA: Diagnosis not present

## 2020-08-27 DIAGNOSIS — F330 Major depressive disorder, recurrent, mild: Secondary | ICD-10-CM | POA: Diagnosis not present

## 2020-08-27 DIAGNOSIS — Z0000 Encounter for general adult medical examination without abnormal findings: Secondary | ICD-10-CM | POA: Diagnosis not present

## 2020-08-27 DIAGNOSIS — Z131 Encounter for screening for diabetes mellitus: Secondary | ICD-10-CM | POA: Diagnosis not present

## 2020-08-27 DIAGNOSIS — R69 Illness, unspecified: Secondary | ICD-10-CM | POA: Diagnosis not present

## 2020-08-28 DIAGNOSIS — R69 Illness, unspecified: Secondary | ICD-10-CM | POA: Diagnosis not present

## 2021-04-20 DIAGNOSIS — C44311 Basal cell carcinoma of skin of nose: Secondary | ICD-10-CM | POA: Diagnosis not present

## 2021-04-27 DIAGNOSIS — D518 Other vitamin B12 deficiency anemias: Secondary | ICD-10-CM | POA: Diagnosis not present

## 2021-04-27 DIAGNOSIS — Z79899 Other long term (current) drug therapy: Secondary | ICD-10-CM | POA: Diagnosis not present

## 2021-04-27 DIAGNOSIS — M545 Low back pain, unspecified: Secondary | ICD-10-CM | POA: Diagnosis not present

## 2021-04-27 DIAGNOSIS — K219 Gastro-esophageal reflux disease without esophagitis: Secondary | ICD-10-CM | POA: Diagnosis not present

## 2021-04-27 DIAGNOSIS — R5383 Other fatigue: Secondary | ICD-10-CM | POA: Diagnosis not present

## 2021-04-27 DIAGNOSIS — Z0000 Encounter for general adult medical examination without abnormal findings: Secondary | ICD-10-CM | POA: Diagnosis not present

## 2021-04-30 ENCOUNTER — Encounter (INDEPENDENT_AMBULATORY_CARE_PROVIDER_SITE_OTHER): Payer: Self-pay | Admitting: *Deleted

## 2021-05-25 DIAGNOSIS — C44311 Basal cell carcinoma of skin of nose: Secondary | ICD-10-CM | POA: Diagnosis not present

## 2021-07-03 ENCOUNTER — Other Ambulatory Visit: Payer: Self-pay | Admitting: Internal Medicine

## 2021-07-14 ENCOUNTER — Other Ambulatory Visit (INDEPENDENT_AMBULATORY_CARE_PROVIDER_SITE_OTHER): Payer: Self-pay

## 2021-07-14 ENCOUNTER — Telehealth (INDEPENDENT_AMBULATORY_CARE_PROVIDER_SITE_OTHER): Payer: Self-pay

## 2021-07-14 ENCOUNTER — Encounter (INDEPENDENT_AMBULATORY_CARE_PROVIDER_SITE_OTHER): Payer: Self-pay

## 2021-07-14 DIAGNOSIS — Z1211 Encounter for screening for malignant neoplasm of colon: Secondary | ICD-10-CM

## 2021-07-14 MED ORDER — PEG 3350-KCL-NA BICARB-NACL 420 G PO SOLR: 4000.0000 mL | ORAL | 0 refills | Status: DC

## 2021-07-14 NOTE — Telephone Encounter (Signed)
Natasha Andrade, CMA  

## 2021-07-14 NOTE — Telephone Encounter (Signed)
Prep sent

## 2021-07-14 NOTE — Telephone Encounter (Signed)
Referring MD/PCP: Hasanaj  Procedure: Tcs  Reason/Indication:  Screening  Has patient had this procedure before?  yes  If so, when, by whom and where?2004  Is there a family history of colon cancer?  no  Who?  What age when diagnosed?    Is patient diabetic? If yes, Type 1 or Type 2   no      Does patient have prosthetic heart valve or mechanical valve?  no  Do you have a pacemaker/defibrillator?  no  Has patient ever had endocarditis/atrial fibrillation? no  Does patient use oxygen? no  Has patient had joint replacement within last 12 months?  no  Is patient constipated or do they take laxatives? no  Does patient have a history of alcohol/drug use?  no  Have you had a stroke/heart attack last 6 mths? no  Do you take medicine for weight loss?  no  For female patients,: do you still have your menstrual cycle? no  Is patient on blood thinner such as Coumadin, Plavix and/or Aspirin? no  Medications: cyanocobalamin 1000 mcg 1 tab once a week, omeprazole 20 mg daily, quetiapine 50 mg daily, zinc daily, Vit C daily, vit D3 daily   Allergies: nkda  Medication Adjustment per Dr Jenetta Downer none   Procedure date & time: Friday 08/07/21 9:20

## 2021-07-14 NOTE — Telephone Encounter (Signed)
Ok to schedule.  Thanks,  Joseff Luckman Castaneda Mayorga, MD Gastroenterology and Hepatology [facility]  

## 2021-07-20 DIAGNOSIS — C44311 Basal cell carcinoma of skin of nose: Secondary | ICD-10-CM | POA: Diagnosis not present

## 2021-07-30 DIAGNOSIS — R5383 Other fatigue: Secondary | ICD-10-CM | POA: Diagnosis not present

## 2021-07-30 DIAGNOSIS — K219 Gastro-esophageal reflux disease without esophagitis: Secondary | ICD-10-CM | POA: Diagnosis not present

## 2021-07-30 DIAGNOSIS — M545 Low back pain, unspecified: Secondary | ICD-10-CM | POA: Diagnosis not present

## 2021-07-30 DIAGNOSIS — Z0000 Encounter for general adult medical examination without abnormal findings: Secondary | ICD-10-CM | POA: Diagnosis not present

## 2021-08-07 ENCOUNTER — Other Ambulatory Visit: Payer: Self-pay

## 2021-08-07 ENCOUNTER — Ambulatory Visit (HOSPITAL_COMMUNITY)
Admission: RE | Admit: 2021-08-07 | Discharge: 2021-08-07 | Disposition: A | Discharge status: Home / Self Care | Payer: Medicare Other | Attending: Gastroenterology | Admitting: Gastroenterology

## 2021-08-07 ENCOUNTER — Encounter (HOSPITAL_COMMUNITY)
Admission: RE | Disposition: A | Discharge status: Home / Self Care | Payer: Self-pay | Source: Home / Self Care | Attending: Gastroenterology

## 2021-08-07 ENCOUNTER — Ambulatory Visit (HOSPITAL_COMMUNITY): Payer: Medicare Other | Admitting: Anesthesiology

## 2021-08-07 ENCOUNTER — Encounter (HOSPITAL_COMMUNITY): Payer: Self-pay | Admitting: Gastroenterology

## 2021-08-07 DIAGNOSIS — Z853 Personal history of malignant neoplasm of breast: Secondary | ICD-10-CM | POA: Insufficient documentation

## 2021-08-07 DIAGNOSIS — Z79899 Other long term (current) drug therapy: Secondary | ICD-10-CM | POA: Diagnosis not present

## 2021-08-07 DIAGNOSIS — K635 Polyp of colon: Secondary | ICD-10-CM | POA: Diagnosis not present

## 2021-08-07 DIAGNOSIS — D123 Benign neoplasm of transverse colon: Secondary | ICD-10-CM | POA: Diagnosis not present

## 2021-08-07 DIAGNOSIS — Z1211 Encounter for screening for malignant neoplasm of colon: Secondary | ICD-10-CM | POA: Diagnosis not present

## 2021-08-07 DIAGNOSIS — K219 Gastro-esophageal reflux disease without esophagitis: Secondary | ICD-10-CM | POA: Insufficient documentation

## 2021-08-07 HISTORY — DX: Gastro-esophageal reflux disease without esophagitis: K21.9

## 2021-08-07 HISTORY — PX: BIOPSY: SHX5522

## 2021-08-07 HISTORY — PX: COLONOSCOPY WITH PROPOFOL: SHX5780

## 2021-08-07 LAB — HM COLONOSCOPY

## 2021-08-07 SURGERY — COLONOSCOPY WITH PROPOFOL
Anesthesia: General

## 2021-08-07 MED ORDER — PROPOFOL 500 MG/50ML IV EMUL
INTRAVENOUS | Status: DC | PRN
  Administered 2021-08-07: 150 ug/kg/min via INTRAVENOUS

## 2021-08-07 MED ORDER — PROPOFOL 10 MG/ML IV BOLUS
INTRAVENOUS | Status: DC | PRN
  Administered 2021-08-07: 50 mg via INTRAVENOUS
  Administered 2021-08-07: 100 mg via INTRAVENOUS

## 2021-08-07 MED ORDER — LACTATED RINGERS IV SOLN: INTRAVENOUS | Status: DC

## 2021-08-07 NOTE — Transfer of Care (Signed)
Immediate Anesthesia Transfer of Care Note  Patient: Natasha Andrade  Procedure(s) Performed: COLONOSCOPY WITH PROPOFOL BIOPSY  Patient Location: Endoscopy Unit  Anesthesia Type:General  Level of Consciousness: awake  Airway & Oxygen Therapy: Patient Spontanous Breathing  Post-op Assessment: Report given to RN and Post -op Vital signs reviewed and stable  Post vital signs: Reviewed and stable  Last Vitals:  Vitals Value Taken Time  BP    Temp    Pulse    Resp    SpO2      Last Pain:  Vitals:   08/07/21 0815  TempSrc: Oral  PainSc: 0-No pain      Patients Stated Pain Goal: 7 (25/50/01 6429)  Complications: No notable events documented.

## 2021-08-07 NOTE — Discharge Instructions (Signed)
You are being discharged to home.  Resume your previous diet.  We are waiting for your pathology results.  Your physician has recommended a repeat colonoscopy for surveillance based on pathology results.  

## 2021-08-07 NOTE — H&P (Signed)
Natasha Andrade is an 65 y.o. female.   Chief Complaint: Screening colonoscopy HPI: 65 year old female with past medical history of breast cancer, GERD, coming for screening colonoscopy.  Patient reports that her last colonoscopy was performed close to 7 years ago, she states that she did not have any alterations but no report is available..  The patient denies having any complaints such as melena, hematochezia, abdominal pain or distention, change in her bowel movement consistency or frequency, no changes in her weight recently.  No family history of colorectal cancer.   Past Medical History:  Diagnosis Date   Breast cancer ([place]) 2008   right   GERD (gastroesophageal reflux disease)     Past Surgical History:  Procedure Laterality Date   APPENDECTOMY     BREAST LUMPECTOMY Right 2008   CHOLECYSTECTOMY     COLONOSCOPY     REDUCTION MAMMAPLASTY Bilateral 2008    History reviewed. No pertinent family history. Social History:  reports that she has never smoked. She has never used smokeless tobacco. She reports current alcohol use. She reports that she does not use drugs.  Allergies: No Known Allergies  Medications Prior to Admission  Medication Sig Dispense Refill   Ascorbic Acid (VITAMIN C) 1000 MG tablet Take 1,000 mg by mouth daily.     cholecalciferol (VITAMIN D3) 25 MCG (1000 UNIT) tablet Take 1,000 Units by mouth daily.     cyanocobalamin (,VITAMIN B-12,) 1000 MCG/ML injection Inject 1,000 mcg into the muscle once a week.     omeprazole (PRILOSEC) 20 MG capsule Take 20 mg by mouth daily.     polyethylene glycol-electrolytes (TRILYTE) 420 g solution Take 4,000 mLs by mouth as directed. 4000 mL 0   QUEtiapine (SEROQUEL) 50 MG tablet Take 50 mg by mouth at bedtime.     zinc gluconate 50 MG tablet Take 50 mg by mouth daily.      No results found for this or any previous visit (from the past 48 hour(s)). No results found.  Review of Systems  Constitutional: Negative.   HENT:  Negative.    Eyes: Negative.   Respiratory: Negative.    Cardiovascular: Negative.   Gastrointestinal: Negative.   Endocrine: Negative.   Genitourinary: Negative.   Musculoskeletal: Negative.   Skin: Negative.   Allergic/Immunologic: Negative.   Neurological: Negative.   Hematological: Negative.   Psychiatric/Behavioral: Negative.     Blood pressure (!) 138/96, pulse 82, temperature 98.4 F (36.9 C), temperature source Oral, resp. rate 12, height 5\' 4"  (1.626 m), weight 74.8 kg, SpO2 97 %. Physical Exam  GENERAL: The patient is AO x3, in no acute distress. HEENT: Head is normocephalic and atraumatic. EOMI are intact. Mouth is well hydrated and without lesions. NECK: Supple. No masses LUNGS: Clear to auscultation. No presence of rhonchi/wheezing/rales. Adequate chest expansion HEART: RRR, normal s1 and s2. ABDOMEN: Soft, nontender, no guarding, no peritoneal signs, and nondistended. BS +. No masses. EXTREMITIES: Without any cyanosis, clubbing, rash, lesions or edema. NEUROLOGIC: AOx3, no focal motor deficit. SKIN: no jaundice, no rashes  Assessment/Plan 65 year old female with past medical history of breast cancer, GERD, coming for screening colonoscopy.  The patient is at average risk for colorectal cancer.  We will proceed with colonoscopy today.   Harvel Quale, MD 08/07/2021, 8:19 AM

## 2021-08-07 NOTE — Op Note (Signed)
[facility] Patient Name: Natasha Andrade Procedure Date: 08/07/2021 9:53 AM MRN: [id number] Date of Birth: November 06, 1955 Attending MD: Maylon Peppers ,  CSN: [id number] Age: 65 Admit Type: Outpatient Procedure:                Colonoscopy Indications:              Screening for colorectal malignant neoplasm Providers:                Maylon Peppers, Janeece Riggers, RN, Nelma Rothman,                            Technician Referring MD:              Medicines:                Monitored Anesthesia Care Complications:            No immediate complications. Estimated Blood Loss:     Estimated blood loss: none. Procedure:                Pre-Anesthesia Assessment:                           - Prior to the procedure, a History and Physical                            was performed, and patient medications, allergies                            and sensitivities were reviewed. The patient's                            tolerance of previous anesthesia was reviewed.                           - The risks and benefits of the procedure and the                            sedation options and risks were discussed with the                            patient. All questions were answered and informed                            consent was obtained.                           - ASA Grade Assessment: II - A patient with mild                            systemic disease.                           After obtaining informed consent, the colonoscope                            was passed under direct vision. Throughout the  procedure, the patient's blood pressure, pulse, and                            oxygen saturations were monitored continuously. The                            [id number]-[id number] ([id number]) scope was introduced through                            the anus and advanced to the the cecum, identified                            by appendiceal orifice and ileocecal valve. The                             colonoscopy was performed without difficulty. The                            patient tolerated the procedure well. The quality                            of the bowel preparation was excellent. Scope In: 10:08:14 AM Scope Out: 10:29:42 AM Scope Withdrawal Time: 0 hours 15 minutes 35 seconds  Total Procedure Duration: 0 hours 21 minutes 28 seconds  Findings:      The perianal and digital rectal examinations were normal.      A 1 mm polyp was found in the transverse colon. The polyp was sessile.       The polyp was removed with a cold biopsy forceps. Resection and       retrieval were complete.      The retroflexed view of the distal rectum and anal verge was normal and       showed no anal or rectal abnormalities. Impression:               - One 1 mm polyp in the transverse colon, removed                            with a cold biopsy forceps. Resected and retrieved.                           - The distal rectum and anal verge are normal on                            retroflexion view. Moderate Sedation:      Per Anesthesia Care Recommendation:           - Discharge patient to home (ambulatory).                           - Resume previous diet.                           - Await pathology results.                           -  Repeat colonoscopy for surveillance based on                            pathology results. Procedure Code(s):        --- Professional ---                           [phone], Colonoscopy, flexible; with biopsy, single                            or multiple Diagnosis Code(s):        --- Professional ---                           Z12.11, Encounter for screening for malignant                            neoplasm of colon                           K63.5, Polyp of colon CPT copyright 2019 American [employer] Association. All rights reserved. The codes documented in this report are preliminary and upon coder review may  be revised to meet current compliance  requirements. Maylon Peppers, MD Maylon Peppers,  08/07/2021 10:31:57 AM This report has been signed electronically. Number of Addenda: 0

## 2021-08-07 NOTE — Anesthesia Postprocedure Evaluation (Signed)
Anesthesia Post Note  Patient: Natasha Andrade  Procedure(s) Performed: COLONOSCOPY WITH PROPOFOL BIOPSY  Patient location during evaluation: Endoscopy Anesthesia Type: General Level of consciousness: awake and alert and oriented Pain management: pain level controlled Vital Signs Assessment: post-procedure vital signs reviewed and stable Respiratory status: spontaneous breathing, nonlabored ventilation and respiratory function stable Cardiovascular status: blood pressure returned to baseline and stable Postop Assessment: no apparent nausea or vomiting Anesthetic complications: no   No notable events documented.   Last Vitals:  Vitals:   08/07/21 0815 08/07/21 1031  BP: (!) 138/96 99/71  Pulse: 82 79  Resp: 12 18  Temp: 36.9 C 36.5 C  SpO2: 97% 97%    Last Pain:  Vitals:   08/07/21 1031  TempSrc: Oral  PainSc: 0-No pain                 Cartina Brousseau C Hamdan Toscano

## 2021-08-07 NOTE — Anesthesia Preprocedure Evaluation (Addendum)
Anesthesia Evaluation  Patient identified by MRN, date of birth, ID band Patient awake    Reviewed: Allergy & Precautions, NPO status , Patient's Chart, lab work & pertinent test results  Airway Mallampati: I  TM Distance: >3 FB Neck ROM: Full    Dental  (+) Dental Advisory Given, Caps   Pulmonary neg pulmonary ROS,    Pulmonary exam normal breath sounds clear to auscultation       Cardiovascular Exercise Tolerance: Good negative cardio ROS Normal cardiovascular exam Rhythm:Regular Rate:Normal     Neuro/Psych negative neurological ROS  negative psych ROS   GI/Hepatic Neg liver ROS, GERD  Medicated and Poorly Controlled,  Endo/Other  negative endocrine ROS  Renal/GU negative Renal ROS  negative genitourinary   Musculoskeletal negative musculoskeletal ROS (+)   Abdominal   Peds negative pediatric ROS (+)  Hematology negative hematology ROS (+)   Anesthesia Other Findings   Reproductive/Obstetrics negative OB ROS                            Anesthesia Physical Anesthesia Plan  ASA: 2  Anesthesia Plan: General   Post-op Pain Management:    Induction:   PONV Risk Score and Plan: TIVA  Airway Management Planned: Nasal Cannula and Natural Airway  Additional Equipment:   Intra-op Plan:   Post-operative Plan:   Informed Consent: I have reviewed the patients History and Physical, chart, labs and discussed the procedure including the risks, benefits and alternatives for the proposed anesthesia with the patient or authorized representative who has indicated his/her understanding and acceptance.     Dental advisory given  Plan Discussed with: CRNA and Surgeon  Anesthesia Plan Comments:        Anesthesia Quick Evaluation

## 2021-08-10 ENCOUNTER — Encounter (INDEPENDENT_AMBULATORY_CARE_PROVIDER_SITE_OTHER): Payer: Self-pay | Admitting: *Deleted

## 2021-08-10 LAB — SURGICAL PATHOLOGY

## 2021-08-11 ENCOUNTER — Encounter (HOSPITAL_COMMUNITY): Payer: Self-pay | Admitting: Gastroenterology

## 2021-11-05 DIAGNOSIS — R5383 Other fatigue: Secondary | ICD-10-CM | POA: Diagnosis not present

## 2021-11-05 DIAGNOSIS — Z0000 Encounter for general adult medical examination without abnormal findings: Secondary | ICD-10-CM | POA: Diagnosis not present

## 2021-11-05 DIAGNOSIS — K219 Gastro-esophageal reflux disease without esophagitis: Secondary | ICD-10-CM | POA: Diagnosis not present

## 2021-11-05 DIAGNOSIS — E559 Vitamin D deficiency, unspecified: Secondary | ICD-10-CM | POA: Diagnosis not present

## 2021-11-05 DIAGNOSIS — D518 Other vitamin B12 deficiency anemias: Secondary | ICD-10-CM | POA: Diagnosis not present

## 2021-11-05 DIAGNOSIS — Z79899 Other long term (current) drug therapy: Secondary | ICD-10-CM | POA: Diagnosis not present

## 2021-11-05 DIAGNOSIS — I10 Essential (primary) hypertension: Secondary | ICD-10-CM | POA: Diagnosis not present

## 2021-11-05 DIAGNOSIS — M545 Low back pain, unspecified: Secondary | ICD-10-CM | POA: Diagnosis not present

## 2021-11-09 DIAGNOSIS — Z1231 Encounter for screening mammogram for malignant neoplasm of breast: Secondary | ICD-10-CM | POA: Diagnosis not present

## 2022-02-11 DIAGNOSIS — K219 Gastro-esophageal reflux disease without esophagitis: Secondary | ICD-10-CM | POA: Diagnosis not present

## 2022-02-11 DIAGNOSIS — M545 Low back pain, unspecified: Secondary | ICD-10-CM | POA: Diagnosis not present

## 2022-02-11 DIAGNOSIS — Z0000 Encounter for general adult medical examination without abnormal findings: Secondary | ICD-10-CM | POA: Diagnosis not present

## 2022-02-11 DIAGNOSIS — M25519 Pain in unspecified shoulder: Secondary | ICD-10-CM | POA: Diagnosis not present

## 2022-02-11 DIAGNOSIS — I10 Essential (primary) hypertension: Secondary | ICD-10-CM | POA: Diagnosis not present

## 2022-02-15 DIAGNOSIS — M810 Age-related osteoporosis without current pathological fracture: Secondary | ICD-10-CM | POA: Diagnosis not present

## 2022-02-15 DIAGNOSIS — M8589 Other specified disorders of bone density and structure, multiple sites: Secondary | ICD-10-CM | POA: Diagnosis not present

## 2022-03-17 IMAGING — MG DIGITAL SCREENING BILAT W/ TOMO W/ CAD
6 of 10 series · 6 of 30 positions shown · non-contrast
Comparison: Previous exam(s).

CLINICAL DATA: Screening.

EXAM:
DIGITAL SCREENING BILATERAL MAMMOGRAM WITH TOMO AND CAD

[L CC synth-2D]
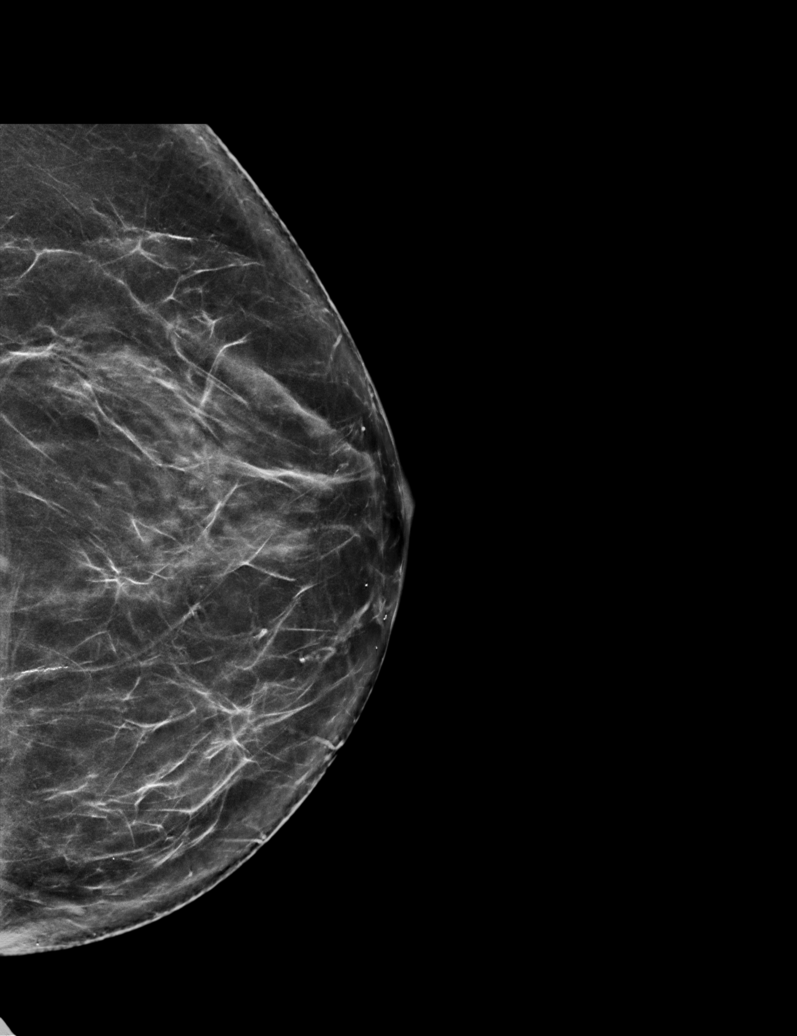

[R CC synth-2D]
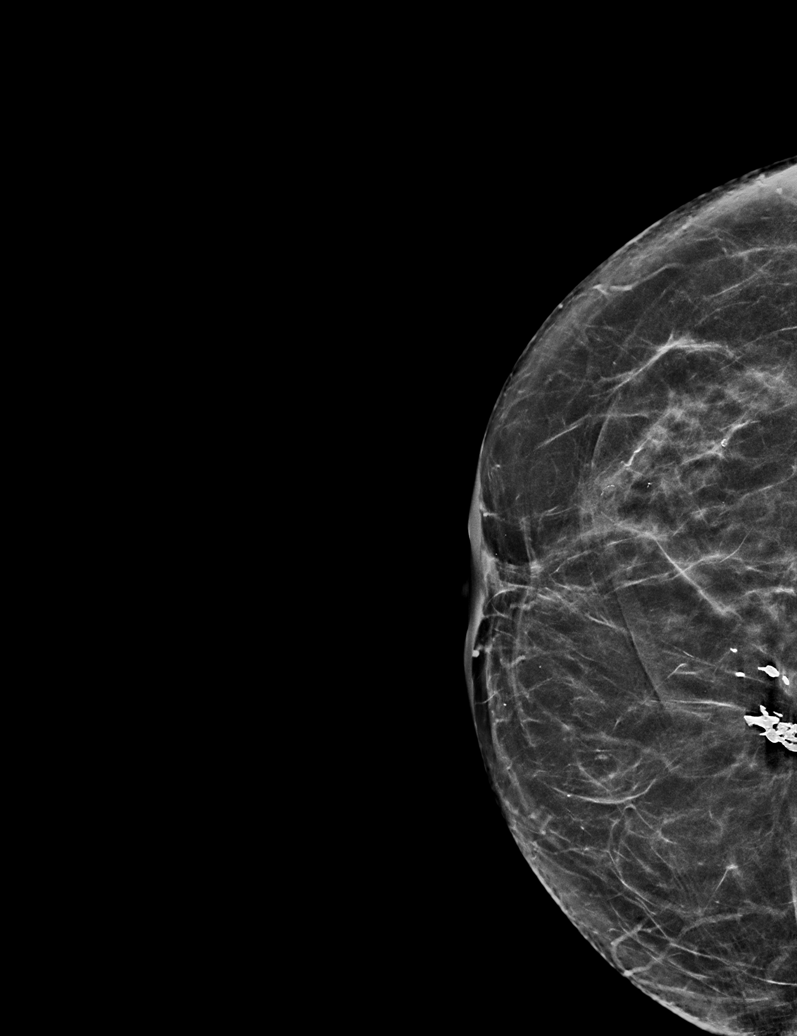

[L XCCL synth-2D]
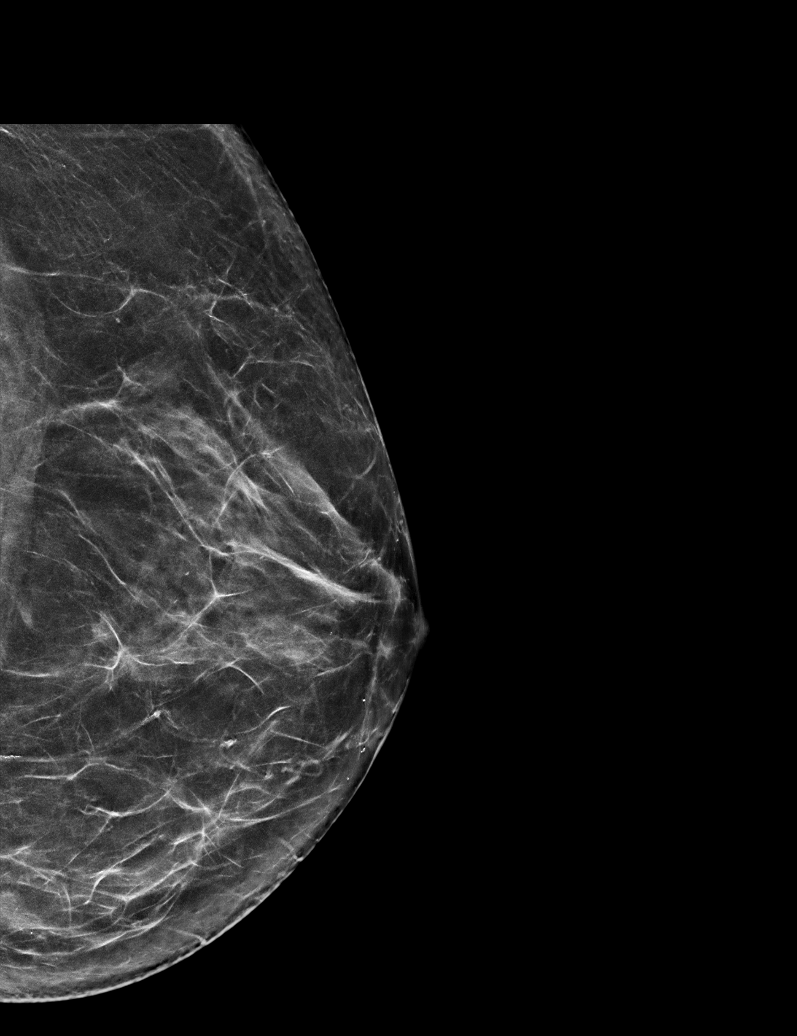

[R MLO synth-2D]
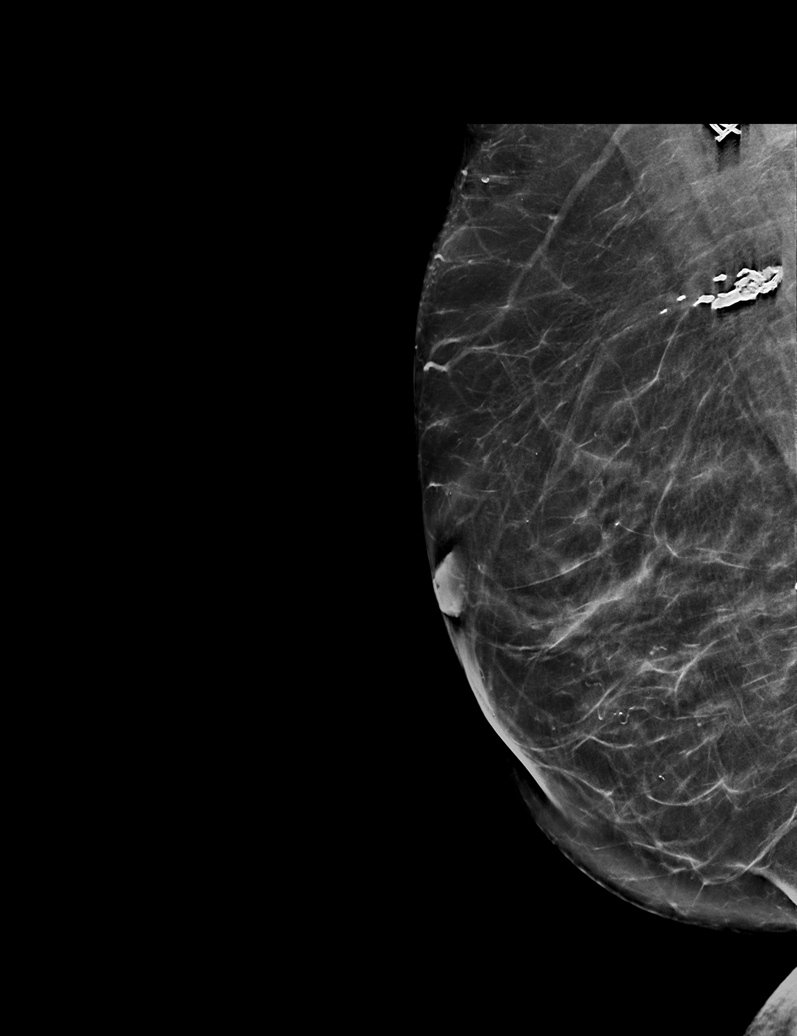

[L MLO synth-2D]
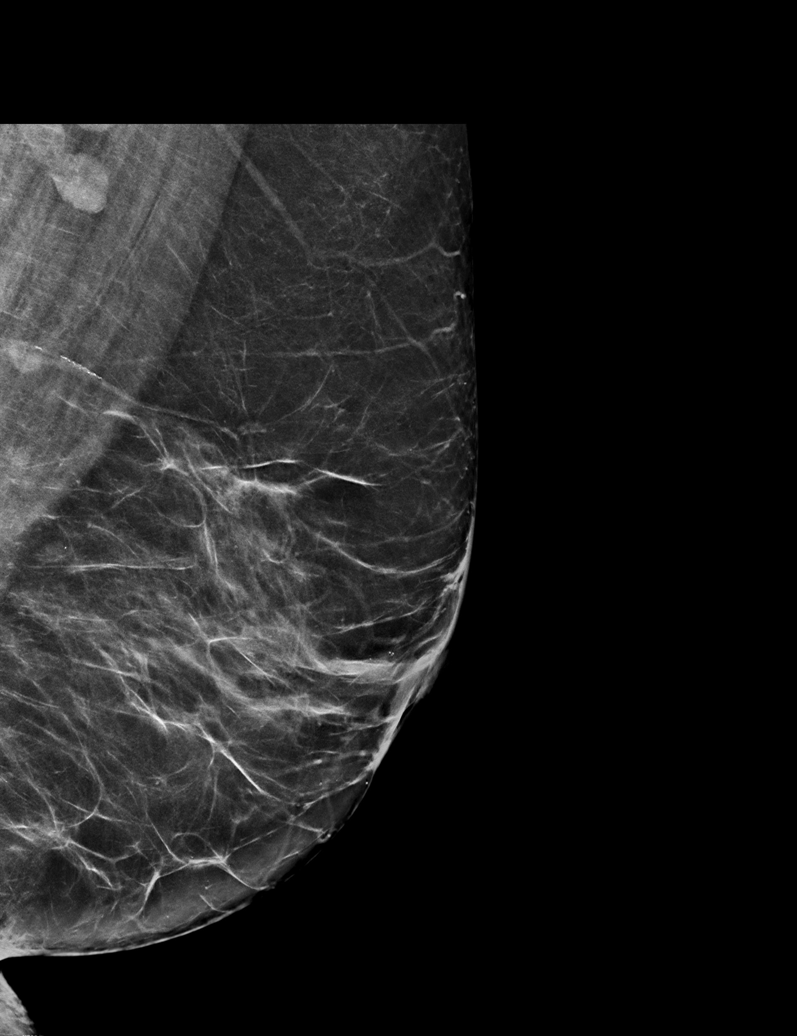

[R CC tomo · tomo slice 31/62.0]
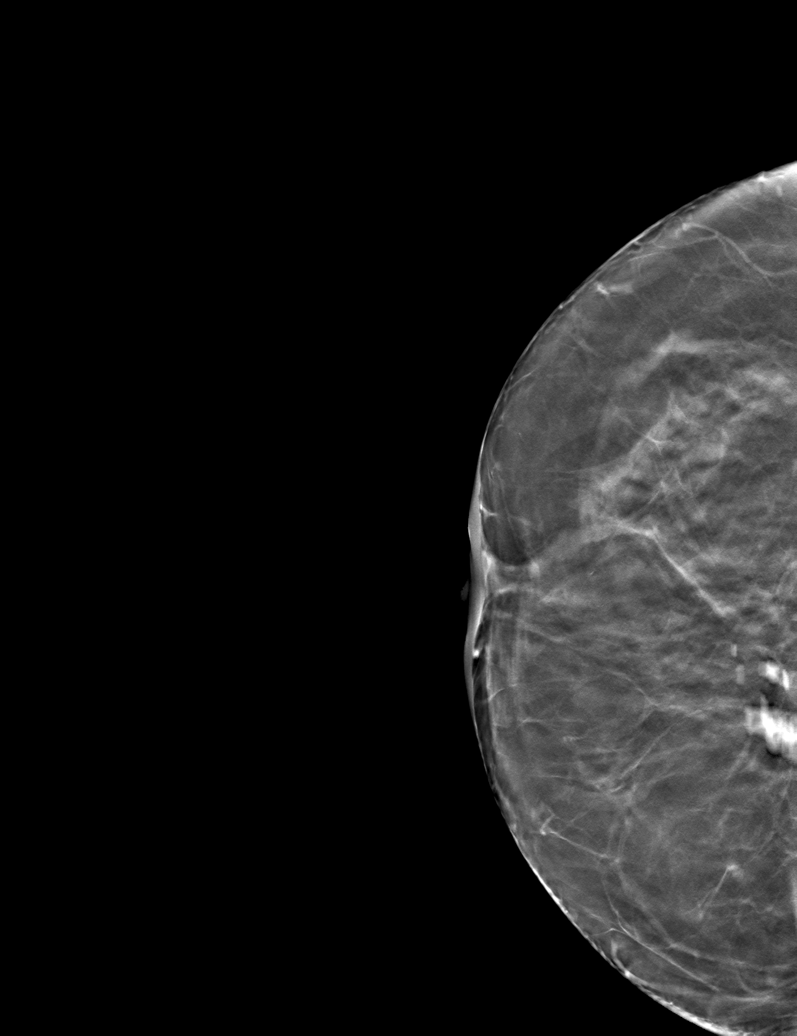

[6 of 30 positions shown; findings below may reference images not displayed]

ACR Breast Density Category b: There are scattered areas of
fibroglandular density.
FINDINGS: There are no findings suspicious for malignancy. Images were
processed with CAD.
IMPRESSION: No mammographic evidence of malignancy. A result letter of this
screening mammogram will be mailed directly to the patient.

RECOMMENDATION:
Screening mammogram in one year. (Code:[id number]-775)

BI-RADS CATEGORY  1: Negative.

## 2022-05-06 DIAGNOSIS — H10013 Acute follicular conjunctivitis, bilateral: Secondary | ICD-10-CM | POA: Diagnosis not present

## 2022-06-10 DIAGNOSIS — Z0000 Encounter for general adult medical examination without abnormal findings: Secondary | ICD-10-CM | POA: Diagnosis not present

## 2022-06-10 DIAGNOSIS — K219 Gastro-esophageal reflux disease without esophagitis: Secondary | ICD-10-CM | POA: Diagnosis not present

## 2022-06-10 DIAGNOSIS — M545 Low back pain, unspecified: Secondary | ICD-10-CM | POA: Diagnosis not present

## 2022-06-10 DIAGNOSIS — I10 Essential (primary) hypertension: Secondary | ICD-10-CM | POA: Diagnosis not present

## 2022-06-10 DIAGNOSIS — M25519 Pain in unspecified shoulder: Secondary | ICD-10-CM | POA: Diagnosis not present

## 2022-09-16 DIAGNOSIS — K219 Gastro-esophageal reflux disease without esophagitis: Secondary | ICD-10-CM | POA: Diagnosis not present

## 2022-09-16 DIAGNOSIS — M25519 Pain in unspecified shoulder: Secondary | ICD-10-CM | POA: Diagnosis not present

## 2022-09-16 DIAGNOSIS — M545 Low back pain, unspecified: Secondary | ICD-10-CM | POA: Diagnosis not present

## 2022-09-16 DIAGNOSIS — I10 Essential (primary) hypertension: Secondary | ICD-10-CM | POA: Diagnosis not present

## 2022-12-06 ENCOUNTER — Other Ambulatory Visit (HOSPITAL_COMMUNITY): Payer: Self-pay | Admitting: Internal Medicine

## 2022-12-06 DIAGNOSIS — Z1231 Encounter for screening mammogram for malignant neoplasm of breast: Secondary | ICD-10-CM

## 2022-12-15 ENCOUNTER — Ambulatory Visit (HOSPITAL_COMMUNITY)
Admission: RE | Admit: 2022-12-15 | Discharge: 2022-12-15 | Disposition: A | Discharge status: Home / Self Care | Payer: Medicare Other | Source: Ambulatory Visit | Attending: Internal Medicine | Admitting: Internal Medicine

## 2022-12-15 DIAGNOSIS — Z1231 Encounter for screening mammogram for malignant neoplasm of breast: Secondary | ICD-10-CM | POA: Diagnosis not present

## 2022-12-16 DIAGNOSIS — M545 Low back pain, unspecified: Secondary | ICD-10-CM | POA: Diagnosis not present

## 2022-12-16 DIAGNOSIS — M25519 Pain in unspecified shoulder: Secondary | ICD-10-CM | POA: Diagnosis not present

## 2022-12-16 DIAGNOSIS — Z0000 Encounter for general adult medical examination without abnormal findings: Secondary | ICD-10-CM | POA: Diagnosis not present

## 2022-12-16 DIAGNOSIS — I10 Essential (primary) hypertension: Secondary | ICD-10-CM | POA: Diagnosis not present

## 2022-12-16 DIAGNOSIS — E559 Vitamin D deficiency, unspecified: Secondary | ICD-10-CM | POA: Diagnosis not present

## 2022-12-16 DIAGNOSIS — K219 Gastro-esophageal reflux disease without esophagitis: Secondary | ICD-10-CM | POA: Diagnosis not present

## 2023-02-21 DIAGNOSIS — X32XXXA Exposure to sunlight, initial encounter: Secondary | ICD-10-CM | POA: Diagnosis not present

## 2023-02-21 DIAGNOSIS — Z85828 Personal history of other malignant neoplasm of skin: Secondary | ICD-10-CM | POA: Diagnosis not present

## 2023-02-21 DIAGNOSIS — C44612 Basal cell carcinoma of skin of right upper limb, including shoulder: Secondary | ICD-10-CM | POA: Diagnosis not present

## 2023-02-21 DIAGNOSIS — L821 Other seborrheic keratosis: Secondary | ICD-10-CM | POA: Diagnosis not present

## 2023-02-21 DIAGNOSIS — L858 Other specified epidermal thickening: Secondary | ICD-10-CM | POA: Diagnosis not present

## 2023-02-21 DIAGNOSIS — Z08 Encounter for follow-up examination after completed treatment for malignant neoplasm: Secondary | ICD-10-CM | POA: Diagnosis not present

## 2023-02-21 DIAGNOSIS — L570 Actinic keratosis: Secondary | ICD-10-CM | POA: Diagnosis not present

## 2023-04-18 DIAGNOSIS — C44612 Basal cell carcinoma of skin of right upper limb, including shoulder: Secondary | ICD-10-CM | POA: Diagnosis not present

## 2023-05-04 DIAGNOSIS — I10 Essential (primary) hypertension: Secondary | ICD-10-CM | POA: Diagnosis not present

## 2023-05-04 DIAGNOSIS — M5459 Other low back pain: Secondary | ICD-10-CM | POA: Diagnosis not present

## 2023-05-04 DIAGNOSIS — K219 Gastro-esophageal reflux disease without esophagitis: Secondary | ICD-10-CM | POA: Diagnosis not present

## 2023-05-04 DIAGNOSIS — Z0000 Encounter for general adult medical examination without abnormal findings: Secondary | ICD-10-CM | POA: Diagnosis not present

## 2023-05-04 DIAGNOSIS — M25519 Pain in unspecified shoulder: Secondary | ICD-10-CM | POA: Diagnosis not present

## 2023-05-30 DIAGNOSIS — C44612 Basal cell carcinoma of skin of right upper limb, including shoulder: Secondary | ICD-10-CM | POA: Diagnosis not present

## 2023-08-01 DIAGNOSIS — Z85828 Personal history of other malignant neoplasm of skin: Secondary | ICD-10-CM | POA: Diagnosis not present

## 2023-08-01 DIAGNOSIS — L905 Scar conditions and fibrosis of skin: Secondary | ICD-10-CM | POA: Diagnosis not present

## 2023-08-01 DIAGNOSIS — Z08 Encounter for follow-up examination after completed treatment for malignant neoplasm: Secondary | ICD-10-CM | POA: Diagnosis not present

## 2023-09-16 DIAGNOSIS — T1490XA Injury, unspecified, initial encounter: Secondary | ICD-10-CM | POA: Diagnosis not present

## 2023-09-19 DIAGNOSIS — M5459 Other low back pain: Secondary | ICD-10-CM | POA: Diagnosis not present

## 2023-09-19 DIAGNOSIS — K219 Gastro-esophageal reflux disease without esophagitis: Secondary | ICD-10-CM | POA: Diagnosis not present

## 2023-09-19 DIAGNOSIS — M25519 Pain in unspecified shoulder: Secondary | ICD-10-CM | POA: Diagnosis not present

## 2023-09-19 DIAGNOSIS — I10 Essential (primary) hypertension: Secondary | ICD-10-CM | POA: Diagnosis not present

## 2023-11-28 DIAGNOSIS — Z08 Encounter for follow-up examination after completed treatment for malignant neoplasm: Secondary | ICD-10-CM | POA: Diagnosis not present

## 2023-11-28 DIAGNOSIS — Z85828 Personal history of other malignant neoplasm of skin: Secondary | ICD-10-CM | POA: Diagnosis not present

## 2024-01-02 ENCOUNTER — Other Ambulatory Visit (HOSPITAL_COMMUNITY): Payer: Self-pay | Admitting: Internal Medicine

## 2024-01-02 DIAGNOSIS — Z1231 Encounter for screening mammogram for malignant neoplasm of breast: Secondary | ICD-10-CM

## 2024-01-05 ENCOUNTER — Ambulatory Visit (HOSPITAL_COMMUNITY)

## 2024-01-18 ENCOUNTER — Ambulatory Visit (HOSPITAL_COMMUNITY)

## 2024-02-07 DIAGNOSIS — I10 Essential (primary) hypertension: Secondary | ICD-10-CM | POA: Diagnosis not present

## 2024-02-07 DIAGNOSIS — M25519 Pain in unspecified shoulder: Secondary | ICD-10-CM | POA: Diagnosis not present

## 2024-02-07 DIAGNOSIS — M5459 Other low back pain: Secondary | ICD-10-CM | POA: Diagnosis not present

## 2024-02-07 DIAGNOSIS — Z0000 Encounter for general adult medical examination without abnormal findings: Secondary | ICD-10-CM | POA: Diagnosis not present

## 2024-02-07 DIAGNOSIS — K219 Gastro-esophageal reflux disease without esophagitis: Secondary | ICD-10-CM | POA: Diagnosis not present

## 2024-02-08 ENCOUNTER — Ambulatory Visit (HOSPITAL_COMMUNITY)
Admission: RE | Admit: 2024-02-08 | Discharge: 2024-02-08 | Disposition: A | Discharge status: Home / Self Care | Source: Ambulatory Visit | Attending: Internal Medicine | Admitting: Internal Medicine

## 2024-02-08 DIAGNOSIS — Z1231 Encounter for screening mammogram for malignant neoplasm of breast: Secondary | ICD-10-CM | POA: Insufficient documentation

## 2024-03-08 DIAGNOSIS — Z08 Encounter for follow-up examination after completed treatment for malignant neoplasm: Secondary | ICD-10-CM | POA: Diagnosis not present

## 2024-03-08 DIAGNOSIS — L308 Other specified dermatitis: Secondary | ICD-10-CM | POA: Diagnosis not present

## 2024-03-08 DIAGNOSIS — Z85828 Personal history of other malignant neoplasm of skin: Secondary | ICD-10-CM | POA: Diagnosis not present

## 2024-03-19 DIAGNOSIS — Z853 Personal history of malignant neoplasm of breast: Secondary | ICD-10-CM | POA: Diagnosis not present

## 2024-03-19 DIAGNOSIS — Z780 Asymptomatic menopausal state: Secondary | ICD-10-CM | POA: Diagnosis not present

## 2024-03-19 DIAGNOSIS — Z13820 Encounter for screening for osteoporosis: Secondary | ICD-10-CM | POA: Diagnosis not present

## 2024-05-02 DIAGNOSIS — H25813 Combined forms of age-related cataract, bilateral: Secondary | ICD-10-CM | POA: Diagnosis not present

## 2024-05-02 DIAGNOSIS — H16223 Keratoconjunctivitis sicca, not specified as Sjogren's, bilateral: Secondary | ICD-10-CM | POA: Diagnosis not present

## 2024-05-08 DIAGNOSIS — M25519 Pain in unspecified shoulder: Secondary | ICD-10-CM | POA: Diagnosis not present

## 2024-05-08 DIAGNOSIS — Z0000 Encounter for general adult medical examination without abnormal findings: Secondary | ICD-10-CM | POA: Diagnosis not present

## 2024-05-08 DIAGNOSIS — K219 Gastro-esophageal reflux disease without esophagitis: Secondary | ICD-10-CM | POA: Diagnosis not present

## 2024-05-08 DIAGNOSIS — I10 Essential (primary) hypertension: Secondary | ICD-10-CM | POA: Diagnosis not present

## 2024-05-08 DIAGNOSIS — M5459 Other low back pain: Secondary | ICD-10-CM | POA: Diagnosis not present

## 2024-05-09 DIAGNOSIS — Z13820 Encounter for screening for osteoporosis: Secondary | ICD-10-CM | POA: Diagnosis not present

## 2024-05-09 DIAGNOSIS — Z780 Asymptomatic menopausal state: Secondary | ICD-10-CM | POA: Diagnosis not present

## 2024-08-07 DIAGNOSIS — M25519 Pain in unspecified shoulder: Secondary | ICD-10-CM | POA: Diagnosis not present

## 2024-08-07 DIAGNOSIS — L309 Dermatitis, unspecified: Secondary | ICD-10-CM | POA: Diagnosis not present

## 2024-08-07 DIAGNOSIS — M5459 Other low back pain: Secondary | ICD-10-CM | POA: Diagnosis not present

## 2024-08-07 DIAGNOSIS — I10 Essential (primary) hypertension: Secondary | ICD-10-CM | POA: Diagnosis not present

## 2024-08-07 DIAGNOSIS — K219 Gastro-esophageal reflux disease without esophagitis: Secondary | ICD-10-CM | POA: Diagnosis not present
